# Patient Record
Sex: Female | Born: 1970
Health system: Southern US, Community
[De-identification: ages and names within clinical notes are randomized; demographics above are authoritative.]

---

## 1997-02-23 ENCOUNTER — Inpatient Hospital Stay (HOSPITAL_COMMUNITY): Admission: AD | Admit: 1997-02-23 | Discharge: 1997-02-25 | Payer: Self-pay | Admitting: *Deleted

## 1998-03-28 ENCOUNTER — Ambulatory Visit (HOSPITAL_COMMUNITY): Admission: RE | Admit: 1998-03-28 | Discharge: 1998-03-28 | Payer: Self-pay | Admitting: *Deleted

## 1998-06-23 ENCOUNTER — Ambulatory Visit (HOSPITAL_COMMUNITY): Admission: RE | Admit: 1998-06-23 | Discharge: 1998-06-23 | Payer: Self-pay | Admitting: *Deleted

## 1998-06-23 ENCOUNTER — Encounter: Payer: Self-pay | Admitting: *Deleted

## 1998-09-08 ENCOUNTER — Inpatient Hospital Stay (HOSPITAL_COMMUNITY): Admission: AD | Admit: 1998-09-08 | Discharge: 1998-09-08 | Payer: Self-pay | Admitting: *Deleted

## 1998-09-13 ENCOUNTER — Inpatient Hospital Stay (HOSPITAL_COMMUNITY): Admission: AD | Admit: 1998-09-13 | Discharge: 1998-09-13 | Payer: Self-pay | Admitting: *Deleted

## 1998-09-16 ENCOUNTER — Inpatient Hospital Stay (HOSPITAL_COMMUNITY): Admission: AD | Admit: 1998-09-16 | Discharge: 1998-09-18 | Payer: Self-pay | Admitting: *Deleted

## 2000-12-16 ENCOUNTER — Other Ambulatory Visit: Admission: RE | Admit: 2000-12-16 | Discharge: 2000-12-16 | Payer: Self-pay | Admitting: Obstetrics and Gynecology

## 2001-06-20 ENCOUNTER — Inpatient Hospital Stay (HOSPITAL_COMMUNITY): Admission: AD | Admit: 2001-06-20 | Discharge: 2001-06-22 | Payer: Self-pay | Admitting: Obstetrics and Gynecology

## 2001-07-31 ENCOUNTER — Other Ambulatory Visit: Admission: RE | Admit: 2001-07-31 | Discharge: 2001-07-31 | Payer: Self-pay | Admitting: Obstetrics and Gynecology

## 2001-08-18 ENCOUNTER — Ambulatory Visit (HOSPITAL_COMMUNITY): Admission: RE | Admit: 2001-08-18 | Discharge: 2001-08-18 | Payer: Self-pay | Admitting: Obstetrics and Gynecology

## 2002-09-30 ENCOUNTER — Other Ambulatory Visit: Admission: RE | Admit: 2002-09-30 | Discharge: 2002-09-30 | Payer: Self-pay | Admitting: Obstetrics and Gynecology

## 2004-02-23 ENCOUNTER — Other Ambulatory Visit: Admission: RE | Admit: 2004-02-23 | Discharge: 2004-02-23 | Payer: Self-pay | Admitting: Obstetrics and Gynecology

## 2005-05-09 ENCOUNTER — Other Ambulatory Visit: Admission: RE | Admit: 2005-05-09 | Discharge: 2005-05-09 | Payer: Self-pay | Admitting: Obstetrics and Gynecology

## 2006-05-15 ENCOUNTER — Other Ambulatory Visit: Admission: RE | Admit: 2006-05-15 | Discharge: 2006-05-15 | Payer: Self-pay | Admitting: Obstetrics and Gynecology

## 2006-12-08 ENCOUNTER — Emergency Department: Payer: Self-pay | Admitting: Emergency Medicine

## 2007-04-24 ENCOUNTER — Other Ambulatory Visit: Admission: RE | Admit: 2007-04-24 | Discharge: 2007-04-24 | Payer: Self-pay | Admitting: Family Medicine

## 2007-12-07 ENCOUNTER — Emergency Department: Payer: Self-pay | Admitting: Emergency Medicine

## 2008-06-17 ENCOUNTER — Other Ambulatory Visit: Admission: RE | Admit: 2008-06-17 | Discharge: 2008-06-17 | Payer: Self-pay | Admitting: Family Medicine

## 2008-07-15 ENCOUNTER — Ambulatory Visit: Payer: Self-pay | Admitting: Internal Medicine

## 2008-07-19 ENCOUNTER — Encounter: Payer: Self-pay | Admitting: Internal Medicine

## 2008-07-30 ENCOUNTER — Encounter: Admission: RE | Admit: 2008-07-30 | Discharge: 2008-07-30 | Payer: Self-pay | Admitting: Family Medicine

## 2009-07-14 ENCOUNTER — Other Ambulatory Visit: Admission: RE | Admit: 2009-07-14 | Discharge: 2009-07-14 | Payer: Self-pay | Admitting: Family Medicine

## 2010-06-09 NOTE — Op Note (Signed)
NAME:  Sarah Hines, Sarah Hines                          ACCOUNT NO.:  [id number]   MEDICAL RECORD NO.:  [id number]                   PATIENT TYPE:  AMB   LOCATION:  SDC                                  FACILITY:  [facility]   PHYSICIAN:  James A. Ashley Royalty, M.D.             DATE OF BIRTH:  1970/10/22   DATE OF PROCEDURE:  DATE OF DISCHARGE:  08/18/2001                                 OPERATIVE REPORT   PREOPERATIVE DIAGNOSIS:  Desire for sterilization.   POSTOPERATIVE DIAGNOSIS:  Desire for sterilization.   PROCEDURE:  Laparoscopic bilateral tubal sterilization procedure (Falope  rings).   SURGEON:  Rudy Jew. Ashley Royalty, M.D.   ANESTHESIA:  General.   ESTIMATED BLOOD LOSS:  Less than 25 cc.   COMPLICATIONS:  None.   DESCRIPTION OF PROCEDURE:  The patient was taken to the operating room and  placed in the dorsal supine position.  After adequate general anesthesia was  administered, she was placed in the lithotomy position and prepped in the  usual manner for abdominal and vaginal surgery.  A posterior weighted  retractor was placed per vagina.  The anterior lip of the cervix was grasped  with a single-tooth tenaculum.  Jarcho uterine manipulator was placed per  cervix and held in place with a tenaculum.  Bladder was drained with a red  rubber catheter.  Next, the operative sites were anesthetized with 0.25%  Marcaine, approximately 3 cc total.  A 1.5-cm infraumbilical incision was  made in a transverse plane.  The size 10/11 laparoscopic trocar was placed  into the abdominal cavity; its location was verified by placement of the  laparoscope.  There was no evidence of any trauma.  Pneumoperitoneum was  created with O2 and maintained throughout the procedure.  Next, an 8-mm  suprapubic midline trocar was placed to accommodate the Falope ring  applicator.  Transillumination and direct visualization techniques were  employed.  There was no evidence of any trauma.  The uterus was identified  and  noted to be normal size, shape and contour.  The fallopian tubes were of  normal size, shape and contour.  They were slightly short but otherwise  completely normal.  The ovaries were of normal size, shape and contour  without evidence of any endometriosis or cysts.  The right fallopian tube  was grasped and traced to its fimbriated end.  An avascular area in the  distal isthmus to proximal ampullary portion was chosen for ring placement  and a Falope ring was applied without difficulty; an excellent knuckle of  tube was noted to be contained within the ring.  Excellent blanching of  tissue was noted.  Appropriate photos were obtained.  The left fallopian  tube was then grasped and traced to its fimbriated end.  An avascular area  in the distal isthmic to proximal ampullary portion was chosen for ring  placement and a Falope ring was applied without difficulty.  An excellent  knuckle of tube was noted to be contained within the ring.  Excellent  blanching of tissue was noted.  Appropriate photos were obtained.  Hemostasis was noted.  The abdominal instruments were then removed and  pneumoperitoneum evacuated.  Fascial defects were closed with 0 Vicryl in an  interrupted fashion.  The skin was closed with 3-0 chromic in a subcuticular  fashion.  Hemostasis was noted.   Vaginal instruments were removed.  Hemostasis was noted.  The procedure was  terminated.   The patient was taken to the recovery room in excellent condition.                                               James A. Ashley Royalty, M.D.    JAM/MEDQ  D:  08/18/2001  T:  08/22/2001  Job:  [id number]

## 2010-06-09 NOTE — H&P (Signed)
[facility]  Patient:    Sarah Hines, GOW Visit Number: [id number] MRN: [id number]          Service Type: OBS Location: [id number] Attending Physician:  Wandalee Ferdinand Dictated by:   Rudy Jew Ashley Royalty, M.D. Admit Date:  06/20/2001 Discharge Date: 06/22/2001                           History and Physical  BRIEF HISTORY:  This is a 40 year old gravida 4, para 3, AB1, who states a desire for attempt at permanent surgical sterilization.  MEDICATIONS:  None.  PAST MEDICAL HISTORY:  None.  PAST SURGICAL HISTORY:  None.  ALLERGIES:  None.  FAMILY HISTORY:  Positive for hypertension and diabetes.  SOCIAL HISTORY:  The patient denies the use of tobacco or significant alcohol.  REVIEW OF SYSTEMS:  Noncontributory.  PHYSICAL EXAMINATION:  GENERAL:  Well-developed well-nourished pleasant black female in no acute distress.  VITAL SIGNS:  Afebrile.  Vital signs stable.  SKIN: Warm and dry without lesions.  LYMPH:  There is no supraclavicular, cervical or inguinal adenopathy.  HEENT:  Normocephalic.  NECK:  Supple, without thyromegaly.  CHEST:  Lungs are clear.  CARDIAC:  Regular rate and rhythm without murmur, gallop, or rubs.  BREASTS:  Examination deferred.  ABDOMEN:  Soft, nontender without masses or organomegaly.  Breath sounds are active.  MUSCULOSKELETAL:  Reveals full range of motion without edema, cyanosis or CVA tenderness.  PELVIC:  Deferred until examination under anesthesia.  IMPRESSION:  Desire for attempt at permanent surgical sterilization.  PLAN:  Laparoscopic bilateral tubal sterilization procedure.  The risks, benefits, complications, and alternatives were fully discussed with the patient.  She states she understands and accepts.  Permanency and failure rates of various techniques including but not limited to fallopian ring, bipolar cautery, mini laparotomy with partial salpingectomy discussed  and accepted.  Questions invited and answered. Dictated by:   Rudy Jew Ashley Royalty, M.D. Attending Physician:  Wandalee Ferdinand DD:  08/18/01 TD:  08/18/01 Job: [id number]

## 2011-10-17 ENCOUNTER — Other Ambulatory Visit: Payer: Self-pay | Admitting: Family Medicine

## 2011-10-17 DIAGNOSIS — Z1231 Encounter for screening mammogram for malignant neoplasm of breast: Secondary | ICD-10-CM

## 2011-11-05 ENCOUNTER — Ambulatory Visit: Payer: Self-pay

## 2012-04-23 ENCOUNTER — Ambulatory Visit: Payer: Self-pay | Admitting: Sports Medicine

## 2012-08-04 ENCOUNTER — Ambulatory Visit: Payer: Self-pay | Admitting: Anesthesiology

## 2012-08-04 LAB — PREGNANCY, URINE: Pregnancy Test, Urine: NEGATIVE m[IU]/mL

## 2012-08-05 ENCOUNTER — Ambulatory Visit: Payer: Self-pay | Admitting: Orthopedic Surgery

## 2012-10-31 ENCOUNTER — Other Ambulatory Visit: Payer: Self-pay | Admitting: Family Medicine

## 2012-10-31 DIAGNOSIS — Z1231 Encounter for screening mammogram for malignant neoplasm of breast: Secondary | ICD-10-CM

## 2012-11-14 ENCOUNTER — Other Ambulatory Visit: Payer: Self-pay | Admitting: Family Medicine

## 2012-11-14 ENCOUNTER — Other Ambulatory Visit (HOSPITAL_COMMUNITY)
Admission: RE | Admit: 2012-11-14 | Discharge: 2012-11-14 | Disposition: A | Discharge status: Home / Self Care | Payer: Managed Care, Other (non HMO) | Source: Ambulatory Visit | Attending: Family Medicine | Admitting: Family Medicine

## 2012-11-14 DIAGNOSIS — Z124 Encounter for screening for malignant neoplasm of cervix: Secondary | ICD-10-CM | POA: Insufficient documentation

## 2012-12-02 ENCOUNTER — Ambulatory Visit
Admission: RE | Admit: 2012-12-02 | Discharge: 2012-12-02 | Disposition: A | Discharge status: Home / Self Care | Payer: Managed Care, Other (non HMO) | Source: Ambulatory Visit | Attending: Family Medicine | Admitting: Family Medicine

## 2012-12-02 DIAGNOSIS — Z1231 Encounter for screening mammogram for malignant neoplasm of breast: Secondary | ICD-10-CM

## 2013-12-25 ENCOUNTER — Other Ambulatory Visit: Payer: Self-pay

## 2013-12-25 ENCOUNTER — Ambulatory Visit
Admission: RE | Admit: 2013-12-25 | Discharge: 2013-12-25 | Disposition: A | Discharge status: Home / Self Care | Payer: Managed Care, Other (non HMO) | Source: Ambulatory Visit

## 2013-12-25 DIAGNOSIS — Z1231 Encounter for screening mammogram for malignant neoplasm of breast: Secondary | ICD-10-CM

## 2014-05-14 NOTE — Op Note (Signed)
PATIENT NAME:  Sarah Hines, Sarah Hines MR#:  [id number] DATE OF BIRTH:  Feb 19, 1970  DATE OF PROCEDURE: 08/05/2012  PREOPERATIVE DIAGNOSIS: Left knee medial meniscus bucket handle tear, chronic anterior cruciate ligament tear, cartilage degeneration.   POSTOPERATIVE DIAGNOSIS:  Left knee medial meniscus bucket handle tear, chronic anterior cruciate ligament tear, cartilage degeneration.   PROCEDURE PERFORMED: Left knee arthroscopy, partial medial meniscectomy, tricompartmental chondroplasty, and limited synovectomy.   SURGEON: Murlean Hark, M.D.   ASSISTANT: None.  TOURNIQUET TIME: Zero minutes.   ANESTHESIA: General.   SURGICAL FINDINGS: Sarah Hines was found to have a severely degenerative bucket handle tear of the posterior horn and body of the medial meniscus that was flipped anteriorly into the joint. The residual rim was found to have a transverse tear and significant fraying. She was found to have some early chondral changes in the medial compartment. The lateral compartment revealed intact meniscus. There is grade 3 chondral wear on the lateral tibial plateau and grade I wear on the lateral femoral condyle. Evaluation of the patella revealed grade 2 to 3 chondral wear at the inferior pole. No loose bodies were noted. ACL was found to be partially torn. Small amount of residual fibers were found to be well adherent to the lateral wall of the notch.   INDICATIONS FOR PROCEDURE: Sarah Hines is a 44 year old female who had sustained multiple injuries to the left knee. Most recently she had tripped while climbing some bleachers sustaining an injury to the knee. The patient decided to put off surgery in order to better coordinate with her work schedule. Planned procedure of meniscectomy was agreed upon. Risks and benefits were explained. The patient's questions are answered.   DESCRIPTION OF PROCEDURE: Sarah Hines was identified in the preoperative holding area. The left lower extremity was  marked as the operative site. The patient was brought into the operating room, placed on the table in supine position. General anesthesia was administered. Tourniquet was applied to the left lower extremity and left lower extremity was prepared and draped in the usual sterile fashion.   Surgical timeout was performed. The patient was identified, extremity was confirmed, procedure was confirmed and reviewed with consent form, images were confirmed.   A standard lateral viewing portal was made. Camera was inserted. The patellofemoral joint was evaluated. The patellofemoral region revealed no loose bodies. The trochlear groove was intact with only grade I cartilage wear in the central portion of the trochlea. The inferior pole of the patella had some chondral fraying. Attention was now turned to the medial gutter, which demonstrated no evidence of loose bodies. Attention was the lateral gutter, which again demonstrated no evidence was bodies.   Camera was now moved into the medial compartment and under direct visualization a medial portal was made. On entry into the medial compartment the flipped bucket handle meniscal fragment was located anteriorly and in the midst of the notch. A blunt trocar was inserted through the medial portal and we attempted to reduce the meniscus. Meniscus was extraordinarily unstable and kept flipping back and forth. Tissue was evaluated by probing. The posterior and body residual meniscal rim was evaluated. It was quite degenerative with a transverse tear. At this time, a biter was inserted into the medial compartment and the portion of the bucket handle flap was cut leaving a stable meniscal edge at the body. Attention was now turned to the back portion of the notch where using a combination of a side biter and a shaver, the posterior horn flap was cut.  The remaining rim of the body and posterior horn were now evaluated with a probe. They were found to be stable, however, there was  extensive fraying. A shaver was inserted to smooth the rim. At this time, a probe was inserted and the chondral surfaces were evaluated. As noted in findings, there was some mild chondral wear on the medial femoral condyle and the medial tibial plateau.   Attention was turned to the notch. There was extensive anterior scarring and fat pad scarring. Fat  pad debridement was undertaken. There were several torn fibers of the anterior cruciate ligament that had formed a prominent stump; this was gently debrided. There was a portion of the anterior cruciate ligament that still appeared well adherent to the lateral wall. This was left intact.   Attention was now turned to the lateral compartment. The lateral meniscus was found to be intact. It was yellowed in color, however, was quite robust and very stable. The tibial plateau revealed significant cartilage softening and a small breach of cartilage. Probe was able to be inserted all the way down to bone. Cartilage and was found to be stable to probing. Femoral condyle was somewhat soft with no evidence of cartilage breech.   Gutters were again checked for loose fragments after meniscal excision. The knee was copiously flushed and then drained. Portals were closed using nylon suture. Subcutaneous tissue about the portals was injected with local Marcaine.   Sterile dressings were applied. The patient was placed in a hinged knee brace.   The patient will be nonweightbearing until she sees physical therapy next week. She has a scheduled follow-up for Friday.   ____________________________ Murlean HarkShalini Jobanny Mavis, MD sr:dp D: 08/06/2012 15:35:00 ET T: 08/06/2012 17:06:26 ET JOB#: [id number]  cc: Murlean HarkShalini Henley Boettner, MD, <Dictator> Murlean HarkSHALINI Chong January MD ELECTRONICALLY SIGNED 08/09/2012 7:45

## 2014-12-28 ENCOUNTER — Other Ambulatory Visit: Payer: Self-pay

## 2014-12-28 DIAGNOSIS — Z1231 Encounter for screening mammogram for malignant neoplasm of breast: Secondary | ICD-10-CM

## 2015-01-12 ENCOUNTER — Other Ambulatory Visit: Payer: Self-pay | Admitting: Family Medicine

## 2015-01-12 DIAGNOSIS — R1310 Dysphagia, unspecified: Secondary | ICD-10-CM

## 2015-01-20 ENCOUNTER — Ambulatory Visit
Admission: RE | Admit: 2015-01-20 | Discharge: 2015-01-20 | Disposition: A | Discharge status: Home / Self Care | Payer: BLUE CROSS/BLUE SHIELD | Source: Ambulatory Visit | Attending: Family Medicine | Admitting: Family Medicine

## 2015-01-20 ENCOUNTER — Ambulatory Visit
Admission: RE | Admit: 2015-01-20 | Discharge: 2015-01-20 | Disposition: A | Discharge status: Home / Self Care | Payer: BLUE CROSS/BLUE SHIELD | Source: Ambulatory Visit

## 2015-01-20 DIAGNOSIS — Z1231 Encounter for screening mammogram for malignant neoplasm of breast: Secondary | ICD-10-CM

## 2015-01-20 DIAGNOSIS — R1310 Dysphagia, unspecified: Secondary | ICD-10-CM

## 2015-05-13 DIAGNOSIS — L298 Other pruritus: Secondary | ICD-10-CM | POA: Diagnosis not present

## 2015-05-13 DIAGNOSIS — R399 Unspecified symptoms and signs involving the genitourinary system: Secondary | ICD-10-CM | POA: Diagnosis not present

## 2015-12-19 ENCOUNTER — Other Ambulatory Visit: Payer: Self-pay | Admitting: Family Medicine

## 2015-12-19 DIAGNOSIS — I10 Essential (primary) hypertension: Secondary | ICD-10-CM | POA: Diagnosis not present

## 2015-12-19 DIAGNOSIS — F419 Anxiety disorder, unspecified: Secondary | ICD-10-CM | POA: Diagnosis not present

## 2015-12-19 DIAGNOSIS — Z23 Encounter for immunization: Secondary | ICD-10-CM | POA: Diagnosis not present

## 2015-12-19 DIAGNOSIS — J45909 Unspecified asthma, uncomplicated: Secondary | ICD-10-CM | POA: Diagnosis not present

## 2015-12-19 DIAGNOSIS — Z1231 Encounter for screening mammogram for malignant neoplasm of breast: Secondary | ICD-10-CM

## 2015-12-19 DIAGNOSIS — N951 Menopausal and female climacteric states: Secondary | ICD-10-CM | POA: Diagnosis not present

## 2015-12-19 DIAGNOSIS — R7303 Prediabetes: Secondary | ICD-10-CM | POA: Diagnosis not present

## 2016-01-02 DIAGNOSIS — J019 Acute sinusitis, unspecified: Secondary | ICD-10-CM | POA: Diagnosis not present

## 2016-01-02 DIAGNOSIS — F411 Generalized anxiety disorder: Secondary | ICD-10-CM | POA: Diagnosis not present

## 2016-01-27 ENCOUNTER — Ambulatory Visit: Payer: BLUE CROSS/BLUE SHIELD

## 2016-02-10 ENCOUNTER — Ambulatory Visit
Admission: RE | Admit: 2016-02-10 | Discharge: 2016-02-10 | Disposition: A | Discharge status: Home / Self Care | Payer: BLUE CROSS/BLUE SHIELD | Source: Ambulatory Visit | Attending: Family Medicine | Admitting: Family Medicine

## 2016-02-10 DIAGNOSIS — Z1231 Encounter for screening mammogram for malignant neoplasm of breast: Secondary | ICD-10-CM | POA: Diagnosis not present

## 2016-04-11 DIAGNOSIS — R300 Dysuria: Secondary | ICD-10-CM | POA: Diagnosis not present

## 2016-05-16 DIAGNOSIS — J40 Bronchitis, not specified as acute or chronic: Secondary | ICD-10-CM | POA: Diagnosis not present

## 2016-05-16 DIAGNOSIS — B9689 Other specified bacterial agents as the cause of diseases classified elsewhere: Secondary | ICD-10-CM | POA: Diagnosis not present

## 2016-05-16 DIAGNOSIS — J019 Acute sinusitis, unspecified: Secondary | ICD-10-CM | POA: Diagnosis not present

## 2016-05-23 DIAGNOSIS — M5432 Sciatica, left side: Secondary | ICD-10-CM | POA: Diagnosis not present

## 2016-05-23 DIAGNOSIS — M542 Cervicalgia: Secondary | ICD-10-CM | POA: Diagnosis not present

## 2016-07-10 ENCOUNTER — Other Ambulatory Visit: Payer: Self-pay | Admitting: Family Medicine

## 2016-07-10 ENCOUNTER — Other Ambulatory Visit (HOSPITAL_COMMUNITY)
Admission: RE | Admit: 2016-07-10 | Discharge: 2016-07-10 | Disposition: A | Discharge status: Home / Self Care | Payer: BLUE CROSS/BLUE SHIELD | Source: Ambulatory Visit | Attending: Family Medicine | Admitting: Family Medicine

## 2016-07-10 DIAGNOSIS — J45909 Unspecified asthma, uncomplicated: Secondary | ICD-10-CM | POA: Diagnosis not present

## 2016-07-10 DIAGNOSIS — Z124 Encounter for screening for malignant neoplasm of cervix: Secondary | ICD-10-CM | POA: Diagnosis not present

## 2016-07-10 DIAGNOSIS — Z13220 Encounter for screening for lipoid disorders: Secondary | ICD-10-CM | POA: Diagnosis not present

## 2016-07-10 DIAGNOSIS — Z0000 Encounter for general adult medical examination without abnormal findings: Secondary | ICD-10-CM | POA: Diagnosis not present

## 2016-07-10 DIAGNOSIS — N951 Menopausal and female climacteric states: Secondary | ICD-10-CM | POA: Diagnosis not present

## 2016-07-10 DIAGNOSIS — J309 Allergic rhinitis, unspecified: Secondary | ICD-10-CM | POA: Diagnosis not present

## 2016-07-10 DIAGNOSIS — I10 Essential (primary) hypertension: Secondary | ICD-10-CM | POA: Diagnosis not present

## 2016-07-10 DIAGNOSIS — R7303 Prediabetes: Secondary | ICD-10-CM | POA: Diagnosis not present

## 2016-07-13 LAB — CYTOLOGY - PAP
Diagnosis: NEGATIVE
HPV (WINDOPATH): NOT DETECTED

## 2017-02-11 DIAGNOSIS — R102 Pelvic and perineal pain: Secondary | ICD-10-CM | POA: Diagnosis not present

## 2017-02-11 DIAGNOSIS — N3090 Cystitis, unspecified without hematuria: Secondary | ICD-10-CM | POA: Diagnosis not present

## 2017-08-06 DIAGNOSIS — I10 Essential (primary) hypertension: Secondary | ICD-10-CM | POA: Diagnosis not present

## 2017-08-06 DIAGNOSIS — Z136 Encounter for screening for cardiovascular disorders: Secondary | ICD-10-CM | POA: Diagnosis not present

## 2017-08-06 DIAGNOSIS — Z0000 Encounter for general adult medical examination without abnormal findings: Secondary | ICD-10-CM | POA: Diagnosis not present

## 2017-08-06 DIAGNOSIS — R7303 Prediabetes: Secondary | ICD-10-CM | POA: Diagnosis not present

## 2017-08-30 DIAGNOSIS — H66002 Acute suppurative otitis media without spontaneous rupture of ear drum, left ear: Secondary | ICD-10-CM | POA: Diagnosis not present

## 2017-08-30 DIAGNOSIS — B9689 Other specified bacterial agents as the cause of diseases classified elsewhere: Secondary | ICD-10-CM | POA: Diagnosis not present

## 2017-08-30 DIAGNOSIS — H6122 Impacted cerumen, left ear: Secondary | ICD-10-CM | POA: Diagnosis not present

## 2017-08-30 DIAGNOSIS — J019 Acute sinusitis, unspecified: Secondary | ICD-10-CM | POA: Diagnosis not present

## 2018-01-27 DIAGNOSIS — M2241 Chondromalacia patellae, right knee: Secondary | ICD-10-CM | POA: Diagnosis not present

## 2018-01-27 DIAGNOSIS — M25561 Pain in right knee: Secondary | ICD-10-CM | POA: Diagnosis not present

## 2018-01-27 DIAGNOSIS — M25461 Effusion, right knee: Secondary | ICD-10-CM | POA: Diagnosis not present

## 2018-01-27 DIAGNOSIS — M1711 Unilateral primary osteoarthritis, right knee: Secondary | ICD-10-CM | POA: Diagnosis not present

## 2018-01-31 DIAGNOSIS — M25461 Effusion, right knee: Secondary | ICD-10-CM | POA: Diagnosis not present

## 2018-01-31 DIAGNOSIS — M2241 Chondromalacia patellae, right knee: Secondary | ICD-10-CM | POA: Diagnosis not present

## 2018-01-31 DIAGNOSIS — M25561 Pain in right knee: Secondary | ICD-10-CM | POA: Diagnosis not present

## 2018-01-31 DIAGNOSIS — M1711 Unilateral primary osteoarthritis, right knee: Secondary | ICD-10-CM | POA: Diagnosis not present

## 2018-02-03 ENCOUNTER — Other Ambulatory Visit (HOSPITAL_COMMUNITY): Payer: Self-pay | Admitting: Sports Medicine

## 2018-02-03 ENCOUNTER — Other Ambulatory Visit: Payer: Self-pay | Admitting: Sports Medicine

## 2018-02-03 DIAGNOSIS — M25561 Pain in right knee: Secondary | ICD-10-CM

## 2018-02-03 DIAGNOSIS — M1711 Unilateral primary osteoarthritis, right knee: Secondary | ICD-10-CM

## 2018-02-03 DIAGNOSIS — M2241 Chondromalacia patellae, right knee: Secondary | ICD-10-CM

## 2018-02-03 DIAGNOSIS — M25461 Effusion, right knee: Secondary | ICD-10-CM

## 2018-02-10 ENCOUNTER — Ambulatory Visit (HOSPITAL_COMMUNITY)
Admission: RE | Admit: 2018-02-10 | Discharge: 2018-02-10 | Disposition: A | Discharge status: Home / Self Care | Payer: BLUE CROSS/BLUE SHIELD | Source: Ambulatory Visit | Attending: Sports Medicine | Admitting: Sports Medicine

## 2018-02-10 DIAGNOSIS — J45909 Unspecified asthma, uncomplicated: Secondary | ICD-10-CM | POA: Diagnosis not present

## 2018-02-10 DIAGNOSIS — M25461 Effusion, right knee: Secondary | ICD-10-CM

## 2018-02-10 DIAGNOSIS — M2241 Chondromalacia patellae, right knee: Secondary | ICD-10-CM | POA: Insufficient documentation

## 2018-02-10 DIAGNOSIS — I10 Essential (primary) hypertension: Secondary | ICD-10-CM | POA: Diagnosis not present

## 2018-02-10 DIAGNOSIS — J069 Acute upper respiratory infection, unspecified: Secondary | ICD-10-CM | POA: Diagnosis not present

## 2018-02-10 DIAGNOSIS — M1711 Unilateral primary osteoarthritis, right knee: Secondary | ICD-10-CM

## 2018-02-10 DIAGNOSIS — M25561 Pain in right knee: Secondary | ICD-10-CM | POA: Insufficient documentation

## 2018-02-10 DIAGNOSIS — Z23 Encounter for immunization: Secondary | ICD-10-CM | POA: Diagnosis not present

## 2018-08-10 DIAGNOSIS — J019 Acute sinusitis, unspecified: Secondary | ICD-10-CM | POA: Diagnosis not present

## 2018-08-10 DIAGNOSIS — H109 Unspecified conjunctivitis: Secondary | ICD-10-CM | POA: Diagnosis not present

## 2018-08-10 DIAGNOSIS — H6123 Impacted cerumen, bilateral: Secondary | ICD-10-CM | POA: Diagnosis not present

## 2019-05-01 ENCOUNTER — Other Ambulatory Visit: Payer: Self-pay | Admitting: Family Medicine

## 2019-05-01 DIAGNOSIS — Z1231 Encounter for screening mammogram for malignant neoplasm of breast: Secondary | ICD-10-CM

## 2019-05-01 DIAGNOSIS — N951 Menopausal and female climacteric states: Secondary | ICD-10-CM | POA: Diagnosis not present

## 2019-05-01 DIAGNOSIS — Z0000 Encounter for general adult medical examination without abnormal findings: Secondary | ICD-10-CM | POA: Diagnosis not present

## 2019-05-01 DIAGNOSIS — I10 Essential (primary) hypertension: Secondary | ICD-10-CM | POA: Diagnosis not present

## 2019-05-01 DIAGNOSIS — R7303 Prediabetes: Secondary | ICD-10-CM | POA: Diagnosis not present

## 2019-05-05 ENCOUNTER — Other Ambulatory Visit: Payer: Self-pay

## 2019-05-05 ENCOUNTER — Ambulatory Visit
Admission: RE | Admit: 2019-05-05 | Discharge: 2019-05-05 | Disposition: A | Discharge status: Home / Self Care | Payer: BC Managed Care – PPO | Source: Ambulatory Visit | Attending: Family Medicine | Admitting: Family Medicine

## 2019-05-05 DIAGNOSIS — Z1231 Encounter for screening mammogram for malignant neoplasm of breast: Secondary | ICD-10-CM | POA: Diagnosis not present

## 2019-06-03 DIAGNOSIS — R399 Unspecified symptoms and signs involving the genitourinary system: Secondary | ICD-10-CM | POA: Diagnosis not present

## 2019-06-03 DIAGNOSIS — R300 Dysuria: Secondary | ICD-10-CM | POA: Diagnosis not present

## 2019-09-07 ENCOUNTER — Other Ambulatory Visit: Payer: Self-pay

## 2019-09-07 ENCOUNTER — Other Ambulatory Visit: Payer: BC Managed Care – PPO

## 2019-09-07 DIAGNOSIS — Z20822 Contact with and (suspected) exposure to covid-19: Secondary | ICD-10-CM

## 2019-09-08 LAB — SARS-COV-2, NAA 2 DAY TAT

## 2019-09-08 LAB — NOVEL CORONAVIRUS, NAA: SARS-CoV-2, NAA: NOT DETECTED

## 2019-09-24 ENCOUNTER — Emergency Department: Payer: BC Managed Care – PPO

## 2019-09-24 ENCOUNTER — Encounter: Payer: Self-pay | Admitting: *Deleted

## 2019-09-24 ENCOUNTER — Other Ambulatory Visit: Payer: Self-pay

## 2019-09-24 DIAGNOSIS — Y999 Unspecified external cause status: Secondary | ICD-10-CM | POA: Insufficient documentation

## 2019-09-24 DIAGNOSIS — Y929 Unspecified place or not applicable: Secondary | ICD-10-CM | POA: Insufficient documentation

## 2019-09-24 DIAGNOSIS — R519 Headache, unspecified: Secondary | ICD-10-CM | POA: Insufficient documentation

## 2019-09-24 DIAGNOSIS — S0990XA Unspecified injury of head, initial encounter: Secondary | ICD-10-CM | POA: Diagnosis not present

## 2019-09-24 DIAGNOSIS — Y939 Activity, unspecified: Secondary | ICD-10-CM | POA: Diagnosis not present

## 2019-09-24 DIAGNOSIS — S0001XA Abrasion of scalp, initial encounter: Secondary | ICD-10-CM | POA: Diagnosis not present

## 2019-09-24 DIAGNOSIS — S199XXA Unspecified injury of neck, initial encounter: Secondary | ICD-10-CM | POA: Diagnosis not present

## 2019-09-24 DIAGNOSIS — M47812 Spondylosis without myelopathy or radiculopathy, cervical region: Secondary | ICD-10-CM | POA: Diagnosis not present

## 2019-09-24 DIAGNOSIS — R220 Localized swelling, mass and lump, head: Secondary | ICD-10-CM | POA: Diagnosis not present
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
## 2019-09-25 ENCOUNTER — Emergency Department
Admission: EM | Admit: 2019-09-25 | Discharge: 2019-09-25 | Disposition: A | Discharge status: Home / Self Care | Payer: BC Managed Care – PPO | Attending: Emergency Medicine | Admitting: Emergency Medicine

## 2019-09-25 DIAGNOSIS — S0001XA Abrasion of scalp, initial encounter: Secondary | ICD-10-CM

## 2019-09-25 NOTE — ED Notes (Signed)
Pt evaluated by dr Homero Fellers and given discharge paperwork from triage 3
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
## 2020-02-05 DIAGNOSIS — J45909 Unspecified asthma, uncomplicated: Secondary | ICD-10-CM | POA: Diagnosis not present

## 2020-02-05 DIAGNOSIS — R7303 Prediabetes: Secondary | ICD-10-CM | POA: Diagnosis not present

## 2020-02-05 DIAGNOSIS — I10 Essential (primary) hypertension: Secondary | ICD-10-CM | POA: Diagnosis not present

## 2020-02-05 DIAGNOSIS — J309 Allergic rhinitis, unspecified: Secondary | ICD-10-CM | POA: Diagnosis not present

## 2020-02-10 DIAGNOSIS — H6123 Impacted cerumen, bilateral: Secondary | ICD-10-CM | POA: Diagnosis not present

## 2020-02-10 DIAGNOSIS — Z03818 Encounter for observation for suspected exposure to other biological agents ruled out: Secondary | ICD-10-CM | POA: Diagnosis not present

## 2020-02-10 DIAGNOSIS — J019 Acute sinusitis, unspecified: Secondary | ICD-10-CM | POA: Diagnosis not present

## 2020-08-05 DIAGNOSIS — R7303 Prediabetes: Secondary | ICD-10-CM | POA: Diagnosis not present

## 2020-08-05 DIAGNOSIS — I10 Essential (primary) hypertension: Secondary | ICD-10-CM | POA: Diagnosis not present

## 2020-08-05 DIAGNOSIS — Z124 Encounter for screening for malignant neoplasm of cervix: Secondary | ICD-10-CM | POA: Diagnosis not present

## 2020-08-05 DIAGNOSIS — J45909 Unspecified asthma, uncomplicated: Secondary | ICD-10-CM | POA: Diagnosis not present

## 2020-08-05 DIAGNOSIS — Z0000 Encounter for general adult medical examination without abnormal findings: Secondary | ICD-10-CM | POA: Diagnosis not present

## 2020-08-05 DIAGNOSIS — J309 Allergic rhinitis, unspecified: Secondary | ICD-10-CM | POA: Diagnosis not present
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
## 2022-04-09 ENCOUNTER — Other Ambulatory Visit: Payer: Self-pay

## 2022-04-09 DIAGNOSIS — I10 Essential (primary) hypertension: Secondary | ICD-10-CM | POA: Diagnosis not present

## 2022-04-09 DIAGNOSIS — Z23 Encounter for immunization: Secondary | ICD-10-CM | POA: Diagnosis not present

## 2022-04-09 DIAGNOSIS — R7303 Prediabetes: Secondary | ICD-10-CM | POA: Diagnosis not present

## 2022-04-09 DIAGNOSIS — Z0000 Encounter for general adult medical examination without abnormal findings: Secondary | ICD-10-CM | POA: Diagnosis not present

## 2022-04-09 DIAGNOSIS — Z1231 Encounter for screening mammogram for malignant neoplasm of breast: Secondary | ICD-10-CM

## 2022-04-09 DIAGNOSIS — R319 Hematuria, unspecified: Secondary | ICD-10-CM | POA: Diagnosis not present

## 2022-05-28 ENCOUNTER — Ambulatory Visit
Admission: RE | Admit: 2022-05-28 | Discharge: 2022-05-28 | Disposition: A | Discharge status: Home / Self Care | Payer: BC Managed Care – PPO | Source: Ambulatory Visit | Attending: Internal Medicine | Admitting: Internal Medicine

## 2022-05-28 DIAGNOSIS — Z1231 Encounter for screening mammogram for malignant neoplasm of breast: Secondary | ICD-10-CM

## 2022-05-29 ENCOUNTER — Other Ambulatory Visit: Payer: Self-pay | Admitting: Internal Medicine

## 2022-05-29 DIAGNOSIS — N6310 Unspecified lump in the right breast, unspecified quadrant: Secondary | ICD-10-CM

## 2022-06-12 ENCOUNTER — Ambulatory Visit
Admission: RE | Admit: 2022-06-12 | Discharge: 2022-06-12 | Disposition: A | Discharge status: Home / Self Care | Payer: BC Managed Care – PPO | Source: Ambulatory Visit | Attending: Internal Medicine | Admitting: Internal Medicine

## 2022-06-12 DIAGNOSIS — R92321 Mammographic fibroglandular density, right breast: Secondary | ICD-10-CM | POA: Diagnosis not present

## 2022-06-12 DIAGNOSIS — R92323 Mammographic fibroglandular density, bilateral breasts: Secondary | ICD-10-CM | POA: Diagnosis not present

## 2022-06-12 DIAGNOSIS — N6310 Unspecified lump in the right breast, unspecified quadrant: Secondary | ICD-10-CM

## 2022-06-12 DIAGNOSIS — R590 Localized enlarged lymph nodes: Secondary | ICD-10-CM | POA: Diagnosis not present

## 2022-07-05 DIAGNOSIS — K635 Polyp of colon: Secondary | ICD-10-CM | POA: Diagnosis not present

## 2022-07-05 DIAGNOSIS — Z1211 Encounter for screening for malignant neoplasm of colon: Secondary | ICD-10-CM | POA: Diagnosis not present

## 2023-12-03 DIAGNOSIS — Z113 Encounter for screening for infections with a predominantly sexual mode of transmission: Secondary | ICD-10-CM | POA: Diagnosis not present

## 2023-12-03 DIAGNOSIS — N898 Other specified noninflammatory disorders of vagina: Secondary | ICD-10-CM | POA: Diagnosis not present

## 2023-12-03 DIAGNOSIS — R399 Unspecified symptoms and signs involving the genitourinary system: Secondary | ICD-10-CM | POA: Diagnosis not present

## 2023-12-03 DIAGNOSIS — N750 Cyst of Bartholin's gland: Secondary | ICD-10-CM | POA: Diagnosis not present

## 2023-12-03 DIAGNOSIS — I10 Essential (primary) hypertension: Secondary | ICD-10-CM | POA: Diagnosis not present

## 2023-12-27 DIAGNOSIS — N898 Other specified noninflammatory disorders of vagina: Secondary | ICD-10-CM | POA: Diagnosis not present

## 2023-12-27 DIAGNOSIS — N758 Other diseases of Bartholin's gland: Secondary | ICD-10-CM | POA: Diagnosis not present

## 2024-01-15 DIAGNOSIS — N750 Cyst of Bartholin's gland: Secondary | ICD-10-CM | POA: Diagnosis not present
